# Patient Record
Sex: Male | Born: 1965 | Race: White | Hispanic: No | Marital: Single | Smoking: Current every day smoker
Health system: Southern US, Community
[De-identification: ages and names within clinical notes are randomized; demographics above are authoritative.]

## PROBLEM LIST (undated history)

## (undated) DIAGNOSIS — I10 Essential (primary) hypertension: Secondary | ICD-10-CM

## (undated) DIAGNOSIS — Z8711 Personal history of peptic ulcer disease: Secondary | ICD-10-CM

## (undated) DIAGNOSIS — Z8719 Personal history of other diseases of the digestive system: Secondary | ICD-10-CM

## (undated) HISTORY — PX: BACK SURGERY: SHX140

---

## 1999-01-23 ENCOUNTER — Emergency Department (HOSPITAL_COMMUNITY): Admission: EM | Admit: 1999-01-23 | Discharge: 1999-01-23 | Payer: Self-pay | Admitting: Emergency Medicine

## 1999-01-24 ENCOUNTER — Encounter: Payer: Self-pay | Admitting: Emergency Medicine

## 2002-03-30 ENCOUNTER — Emergency Department (HOSPITAL_COMMUNITY): Admission: EM | Admit: 2002-03-30 | Discharge: 2002-03-30 | Payer: Self-pay | Admitting: Emergency Medicine

## 2002-03-30 ENCOUNTER — Encounter: Payer: Self-pay | Admitting: Emergency Medicine

## 2015-05-06 ENCOUNTER — Emergency Department (HOSPITAL_COMMUNITY)
Admission: EM | Admit: 2015-05-06 | Discharge: 2015-05-06 | Disposition: A | Discharge status: Home / Self Care | Payer: Medicaid Other | Attending: Emergency Medicine | Admitting: Emergency Medicine

## 2015-05-06 ENCOUNTER — Emergency Department (HOSPITAL_COMMUNITY): Payer: Medicaid Other

## 2015-05-06 ENCOUNTER — Encounter (HOSPITAL_COMMUNITY): Payer: Self-pay | Admitting: Nurse Practitioner

## 2015-05-06 DIAGNOSIS — R55 Syncope and collapse: Secondary | ICD-10-CM | POA: Diagnosis not present

## 2015-05-06 DIAGNOSIS — M546 Pain in thoracic spine: Secondary | ICD-10-CM | POA: Insufficient documentation

## 2015-05-06 DIAGNOSIS — Z720 Tobacco use: Secondary | ICD-10-CM | POA: Insufficient documentation

## 2015-05-06 DIAGNOSIS — Z8719 Personal history of other diseases of the digestive system: Secondary | ICD-10-CM | POA: Diagnosis not present

## 2015-05-06 DIAGNOSIS — Z880 Allergy status to penicillin: Secondary | ICD-10-CM | POA: Insufficient documentation

## 2015-05-06 DIAGNOSIS — I10 Essential (primary) hypertension: Secondary | ICD-10-CM | POA: Insufficient documentation

## 2015-05-06 HISTORY — DX: Essential (primary) hypertension: I10

## 2015-05-06 HISTORY — DX: Personal history of other diseases of the digestive system: Z87.19

## 2015-05-06 HISTORY — DX: Personal history of peptic ulcer disease: Z87.11

## 2015-05-06 LAB — BASIC METABOLIC PANEL
Anion gap: 6 (ref 5–15)
BUN: 10 mg/dL (ref 6–20)
CO2: 25 mmol/L (ref 22–32)
Calcium: 9.1 mg/dL (ref 8.9–10.3)
Chloride: 109 mmol/L (ref 101–111)
Creatinine, Ser: 0.95 mg/dL (ref 0.61–1.24)
GFR calc Af Amer: >60 mL/min (ref >60)
GFR calc non Af Amer: >60 mL/min (ref >60)
Glucose, Bld: 105 mg/dL — ABNORMAL HIGH (ref 65–99)
Potassium: 3.8 mmol/L (ref 3.5–5.1)
Sodium: 140 mmol/L (ref 135–145)

## 2015-05-06 LAB — CBC
HCT: 38.6 % — ABNORMAL LOW (ref 39.0–52.0)
Hemoglobin: 13.1 g/dL (ref 13.0–17.0)
MCH: 32.8 pg (ref 26.0–34.0)
MCHC: 33.9 g/dL (ref 30.0–36.0)
MCV: 96.7 fL (ref 78.0–100.0)
Platelets: 190 10*3/uL (ref 150–400)
RBC: 3.99 MIL/uL — ABNORMAL LOW (ref 4.22–5.81)
RDW: 13.4 % (ref 11.5–15.5)
WBC: 11 10*3/uL — ABNORMAL HIGH (ref 4.0–10.5)

## 2015-05-06 LAB — I-STAT CG4 LACTIC ACID, ED: Lactic Acid, Venous: 1.33 mmol/L (ref 0.5–2.0)

## 2015-05-06 MED ORDER — SODIUM CHLORIDE 0.9 % IV BOLUS (SEPSIS)
1000.0000 mL | Freq: Once | INTRAVENOUS | Status: AC
  Administered 2015-05-06: 1000 mL via INTRAVENOUS

## 2015-05-06 MED ORDER — OXYCODONE HCL 5 MG PO TABS
5.0000 mg | ORAL_TABLET | Freq: Once | ORAL | Status: AC
  Administered 2015-05-06: 5 mg via ORAL
  Filled 2015-05-06: qty 1

## 2015-05-06 NOTE — Discharge Instructions (Signed)
Near-Syncope °Near-syncope (commonly known as near fainting) is sudden weakness, dizziness, or feeling like you might pass out. During an episode of near-syncope, you may also develop pale skin, have tunnel vision, or feel sick to your stomach (nauseous). Near-syncope may occur when getting up after sitting or while standing for a long time. It is caused by a sudden decrease in blood flow to the brain. This decrease can result from various causes or triggers, most of which are not serious. However, because near-syncope can sometimes be a sign of something serious, a medical evaluation is required. The specific cause is often not determined. °HOME CARE INSTRUCTIONS  °Monitor your condition for any changes. The following actions may help to alleviate any discomfort you are experiencing: °· Have someone stay with you until you feel stable. °· Lie down right away and prop your feet up if you start feeling like you might faint. Breathe deeply and steadily. Wait until all the symptoms have passed. Most of these episodes last only a few minutes. You may feel tired for several hours.   °· Drink enough fluids to keep your urine clear or pale yellow.   °· If you are taking blood pressure or heart medicine, get up slowly when seated or lying down. Take several minutes to sit and then stand. This can reduce dizziness. °· Follow up with your health care provider as directed.  °SEEK IMMEDIATE MEDICAL CARE IF:  °· You have a severe headache.   °· You have unusual pain in the chest, abdomen, or back.   °· You are bleeding from the mouth or rectum, or you have black or tarry stool.   °· You have an irregular or very fast heartbeat.   °· You have repeated fainting or have seizure-like jerking during an episode.   °· You faint when sitting or lying down.   °· You have confusion.   °· You have difficulty walking.   °· You have severe weakness.   °· You have vision problems.   °MAKE SURE YOU:  °· Understand these instructions. °· Will  watch your condition. °· Will get help right away if you are not doing well or get worse. °Document Released: 07/21/2005 Document Revised: 07/26/2013 Document Reviewed: 12/24/2012 °ExitCare® Patient Information ©2015 [employer]. This information is not intended to replace advice given to you by your health care provider. Make sure you discuss any questions you have with your health care provider. ° ° °Emergency Department Resource Guide °1) Find a Doctor and Pay Out of Pocket °Although you won't have to find out who is covered by your insurance plan, it is a good idea to ask around and get recommendations. You will then need to call the office and see if the doctor you have chosen will accept you as a new patient and what types of options they offer for patients who are self-pay. Some doctors offer discounts or will set up payment plans for their patients who do not have insurance, but you will need to ask so you aren't surprised when you get to your appointment. ° °2) Contact Your Local Health Department °Not all health departments have doctors that can see patients for sick visits, but many do, so it is worth a call to see if yours does. If you don't know where your local health department is, you can check in your phone book. The CDC also has a tool to help you locate your state's health department, and many state websites also have listings of all of their local health departments. ° °3) Find a Walk-in Clinic °  If your illness is not likely to be very severe or complicated, you may want to try a walk in clinic. These are popping up all over the country in pharmacies, drugstores, and shopping centers. They're usually staffed by nurse practitioners or physician assistants that have been trained to treat common illnesses and complaints. They're usually fairly quick and inexpensive. However, if you have serious medical issues or chronic medical problems, these are probably not your best option. ° °No Primary Care  Doctor: °- Call [facility] at  832-8000 - they can help you locate a primary care doctor that  accepts your insurance, provides certain services, etc. °- Physician Referral Service- 1-800-533-3463 ° °Chronic Pain Problems: °Organization         Address  Phone   Notes  °[facility]  (336) 297-2271 Patients need to be referred by their primary care doctor.  ° °Medication Assistance: °Organization         Address  Phone   Notes  °[place] [facility] [address] °[place], [place] [address] (336) 641-8030 --Must be a resident of [place] °-- Must have NO insurance coverage whatsoever (no Medicaid/ Medicare, etc.) °-- The pt. MUST have a primary care doctor that directs their care regularly and follows them in the community °  °[facility]  (866) 331-1348   °[employer]  (888) 892-1162   ° °Agencies that provide inexpensive medical care: °Organization         Address  Phone   Notes  °[facility]  (336) 832-8035   °[facility]    (336) 832-7272   °[facility] [address] °[place], [place] [address] (336) 832-4777   °[facility] [address], °[place] (336) 271-4999   °[facility]    (336) 373-0678   °[facility]    (336) 272-1050   °[facility] ° [address], [place] Phone:  (336) 832-4444, Fax:  (336) 832-4440 Hours of Operation:  9 am - 6 pm, M-F.  Also accepts Medicaid/Medicare and self-pay.  °[facility] ° [address], [place] Phone: (336) 832-3150, Fax: (336) 832-3151. Hours of Operation:  8:30 am - 5:30 pm, M-F.  Also accepts Medicaid and self-pay.  °[facility] [place] [address], [place] Phone: (336) 878-6027   °[facility] [address], [place], [place] (336)723-1848, Ext. 123 Mondays & Thursdays: 7-9 AM.  First 15 patients are seen on a first  come, first serve basis. °  ° °Medicaid-accepting [place] Providers: ° °Organization         Address  Phone   Notes  °[facility] [address], [place] (336) 641-2100 Also accepts self-pay patients.  °[facility] [address], [place] ° (336) 856-9996   °[facility] [address], [place] (336) 288-8857   °[facility] [address] [place] [address], [place] (336) 299-7000   °Veita Bland [address], [place]  ° (336) 373-1557 Only accepts [place] Access Medicaid patients after they have their name applied to their card.  ° °Self-Pay (no insurance) in [place]: ° °Organization         Address  Phone   Notes  °Sickle Cell Patients, [facility] [address], [place] (336)   832-1970   °[facility] [address], [place] (336) 832-4400   °[facility] ° [address], [place] (336) 992-4800   °[facility]/Dr. Osei-Bonsu ° [address] [place] [address], [place] or [address], [place] (336) [phone] Phone number for both [place] and [place] locations is the same.  °[facility] [address], [place] (336) 299-0000   °[facility] [address] [place] [address], [place] or [address] (336) 852-7530 °(336) 878-2260   °[facility] [address], [place] (336) 350-1642, phone; (336) 294-5005, fax Sees patients 1st and 3rd Saturday of every month.  Must not qualify for public or private insurance (i.e. Medicaid, Medicare, [place] Health Choice, Veterans' Benefits) • Household income should be no more than 200% of the poverty level •The clinic cannot treat you if you are pregnant or think you are pregnant • Sexually transmitted diseases are not treated at the clinic.  ° ° °Dental Care: °Organization          Address  Phone  Notes  °[facility] [address], [place] (336) 641-6152 Accepts children up to age 21 who are enrolled in Medicaid or [place] Health Choice; pregnant women with a Medicaid card; and children who have applied for Medicaid or [place] Health Choice, but were declined, whose parents can pay a reduced fee at time of service.  °[facility] [place]  [address], [place] (336) 641-7733 Accepts children up to age 21 who are enrolled in Medicaid or [place] Health Choice; pregnant women with a Medicaid card; and children who have applied for Medicaid or [place] Health Choice, but were declined, whose parents can pay a reduced fee at time of service.  °[facility] ° [address], [place] (336) 641-4533 Patients are seen by appointment only. Walk-ins are not accepted. [facility] will see patients 18 years of age and older. °Monday - Tuesday (8am-5pm) °Most Wednesdays (8:30-5pm) °$30 per visit, cash only  °[facility] ° [address], [place] (336) 641-4533 Patients are seen by appointment only. Walk-ins are not accepted. [facility] will see patients 18 years of age and older. °One Wednesday Evening (Monthly: Volunteer Based).  $30 per visit, cash only  °[facility]  (919) 537-3737 for adults; Children under age 4, call Graduate Pediatric Dentistry at ([phone]) [phone]. Children aged 4-14, please call (919) [phone] to request a pediatric application. ° Dental services are provided in all areas of dental care including fillings, crowns and bridges, complete and partial dentures, implants, gum treatment, root canals, and extractions. Preventive care is also provided. Treatment is provided to both adults and children. °Patients are selected via a lottery and there is often a waiting list. °  °[facility] [address], ° ° (336) 763-8833 [email or web address] °  °[facility] [address], [place], [place] (336)723-1848, Ext. 123 Second and Fourth Thursday of each month, opens at 6:30 AM; Clinic ends at 9 AM.  Patients are seen on a first-come first-served basis, and a limited number are seen during each clinic.  ° °[facility] ° [address], [place], [place] (336) 723-7904   Eligibility Requirements °You must have lived in [place], [place], or [place] counties for   at least the last three months. °  You cannot be eligible for state or federal sponsored healthcare insurance, including Veterans Administration, Medicaid, or Medicare. °  You generally cannot be eligible for healthcare insurance through your employer.  °  How to apply: °Eligibility screenings are held every Tuesday and Wednesday afternoon from 1:00 pm until 4:00 pm. You do not need an appointment for the interview!  °[facility] [address], [place], [place] 336-631-2330   °[facility]  336-342-8273   °[facility]  336-703-3100   °[facility]  336-570-6415   ° °Behavioral Health Resources in the Community: °Intensive Outpatient Programs °Organization         Address  Phone  Notes  °[facility] [address], [place], [place] 336-878-6098   °[facility] [address], [place], [place] 336-832-9800   °[facility] [address], [place], [place] ° 336-882-2125   °[facility] [address],  °[place], [place] 1-800-853-5163 or 336-641-4981   °Substance Abuse Resources °Organization         Address  Phone  Notes  °[facility]  336-882-2125   °[facility]  336-784-9470   °[facility]  336-285-9073   °[facility]  336-845-3988   °Residential & Outpatient Substance Abuse Program  1-800-659-3381   °Psychological  Services °Organization         Address  Phone  Notes  °[facility]  336- 832-9600   °[facility]  336- 378-7881   °[facility] [address], [place] 1-800-853-5163 or 336-641-4981   ° °Mobile Crisis Teams °Organization         Address  Phone  Notes  °[facility], Mobile Crisis Care Unit  1-877-626-1772   °Assertive °[facility] ° [address] [place], [place] 336-834-9664   °Sharon DeEsch [address] °[place] 336-554-5454   ° °Self-Help/Support Groups °Organization         Address  Phone             Notes  °[facility] - variety of support groups  336- 373-1402 Call for more information  °Narcotics Anonymous (NA), [facility] [address], °[place]  2 meetings at this location  ° °Residential Treatment Programs °Organization         Address  Phone  Notes  °[facility] [address],    °[place]  1-866-801-8205   °[facility] ° [address], [place], [place] 704-293-8524   °[facility] [address], [place] 336-845-3988 Admissions: 8am-3pm M-F  °[facility] [address]    °[place], [place] 336-841-1104   °[facility] [address], [place], [place] 336-379-7146   °[facility] [address]  °[place], [place] 336-285-9073   °[facility] - Intensive Outpatient [address], [place], [place] 336-852-3033   °[facility] [address]  °[place], [place] 1-877-615-2722 or 336-784-9470   °[facility] [address] [place], [place] 336-227-7417 Accepts Medicaid  °[facility] [address]  °[place] 1-800-659-3381 Substance Abuse/Addiction Treatment  ° °[place] Behavioral Health Resources °Organization         Address  Phone  Notes  °[facility]  (888)    581-9988   °Julie Brannon, PhD [address] [place], [place]   (336) 349-5553 or (336) 951-0000   °[facility]   [address] °[place], [place] (336) 349-4454   °[facility] [address], [place], [place] (336) 342-8316 Insurance/Medicaid/sponsorship through [facility]  °[facility] [address]                                    [place], [place] (336) 342-8316 Therapy/tele-psych/case  °[facility] [address]  ° [place], [place] (336) 349-2233    °Dr. Arfeen  (336) 349-4544   °[facility] [place]  [employer] [place] [facility] 1) [address], [place] °2) [address], [place] °3)  [address], [place] (336) 349-3220 °(336) 342-7768 ° °(336) 342-8140   °[place] [facility] (336) 342-1394 or (336) 342-3537 (After Hours)    ° ° ° °
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
## 2015-05-06 NOTE — ED Provider Notes (Signed)
CSN: [id number]     Arrival date & time 05/06/15  1815 History   First MD Initiated Contact with Patient 05/06/15 1827     Chief Complaint  Patient presents with  . Near Syncope     (Consider location/radiation/quality/duration/timing/severity/associated sxs/prior Treatment) Patient is a 49 y.o. male presenting with near-syncope. The history is provided by the patient.  Near Syncope This is a new problem. The current episode started less than 1 hour ago. The problem occurs constantly. The problem has been resolved. Pertinent negatives include no chest pain, no abdominal pain and no shortness of breath. Nothing aggravates the symptoms. Nothing relieves the symptoms.    Past Medical History  Diagnosis Date  . Hypertension   . History of stomach ulcers    Past Surgical History  Procedure Laterality Date  . Back surgery     No family history on file. Social History  Substance Use Topics  . Smoking status: Current Every Day Smoker -- 1.00 packs/day    Types: Cigarettes  . Smokeless tobacco: Never Used  . Alcohol Use: Yes     Comment: Occasional    Review of Systems  Constitutional: Negative for fever.  Respiratory: Negative for cough and shortness of breath.   Cardiovascular: Positive for near-syncope. Negative for chest pain and leg swelling.  Gastrointestinal: Negative for vomiting and abdominal pain.  All other systems reviewed and are negative.     Allergies  Penicillins and Ibuprofen  Home Medications   Prior to Admission medications   Not on File   BP 104/61 mmHg  Pulse 78  Temp(Src) 98.4 F (36.9 C) (Oral)  Resp 16  SpO2 97% Physical Exam  Constitutional: He is oriented to person, place, and time. He appears well-developed and well-nourished. No distress.  HENT:  Head: Normocephalic and atraumatic.  Mouth/Throat: Oropharynx is clear and moist. No oropharyngeal exudate.  Eyes: EOM are normal. Pupils are equal, round, and reactive to light.  Neck:  Normal range of motion. Neck supple.  Cardiovascular: Normal rate and regular rhythm.  Exam reveals no friction rub.   No murmur heard. Pulmonary/Chest: Effort normal and breath sounds normal. No respiratory distress. He has no wheezes. He has no rales.  Abdominal: Soft. He exhibits no distension. There is no tenderness. There is no rebound.  Musculoskeletal: Normal range of motion. He exhibits no edema.       Thoracic back: He exhibits bony tenderness (lower thoracic spine).  Neurological: He is alert and oriented to person, place, and time.  Skin: No rash noted. He is not diaphoretic.  Nursing note and vitals reviewed.   ED Course  Procedures (including critical care time) Labs Review Labs Reviewed  BASIC METABOLIC PANEL - Abnormal; Notable for the following:    Glucose, Bld 105 (*)    All other components within normal limits  CBC - Abnormal; Notable for the following:    WBC 11.0 (*)    RBC 3.99 (*)    HCT 38.6 (*)    All other components within normal limits  I-STAT CG4 LACTIC ACID, ED    Imaging Review Dg Chest 2 View  05/06/2015   CLINICAL DATA:  Fall.  Near syncope.  Thoracic back pain.  EXAM: CHEST  2 VIEW  COMPARISON:  None.  FINDINGS: Normal heart size. Normal mediastinal contour. No pneumothorax. No pleural effusion. Clear lungs, with no focal lung consolidation and no pulmonary edema. No displaced fracture in the visualized chest.  IMPRESSION: No active disease in the chest.  Electronically Signed   By: Delbert Phenix M.D.   On: 05/06/2015 21:13   Dg Thoracic Spine 2 View  05/06/2015   CLINICAL DATA:  Acute onset of upper back pain after syncope. Initial encounter.  EXAM: THORACIC SPINE 2 VIEWS  COMPARISON:  None.  FINDINGS: There is no evidence of fracture or subluxation. Vertebral bodies demonstrate normal height and alignment. Intervertebral disc spaces are preserved.  The visualized portions of both lungs are clear. The mediastinum is unremarkable in appearance. Clips  are noted within the right upper quadrant, reflecting prior cholecystectomy.  IMPRESSION: No evidence of fracture or subluxation along the thoracic spine.   Electronically Signed   By: Roanna Raider M.D.   On: 05/06/2015 21:13   I have personally reviewed and evaluated these images and lab results as part of my medical decision-making.   EKG Interpretation None      MDM   Final diagnoses:  Near syncope    44M here after a near syncopal event. He was standing up watching the ballgame and felt dizzy and then fell backwards. He did not his head. He did not lose conscious. He feels better now. He is mildly hypotensive on prior arrival in the 90s, but is improving. He could not stand her orthostatics because he was too dizzy. Denies any chest pain, vomiting, shortness of breath. He does have some lower thoracic back pain. Will check labs and x-ray. Labs okay. X-rays are okay. Blood pressures improved with fluids. He's doing well and relaxing comfortably. Stable for discharge.  Elwin Mocha, MD 05/07/15 [phone]

## 2015-05-06 NOTE — ED Notes (Signed)
Patient transported to X-ray 

## 2015-05-06 NOTE — ED Notes (Signed)
Bed: WU98 Expected date:  Expected time:  Means of arrival:  Comments: EMS- 49 yo syncope

## 2017-04-04 IMAGING — CR DG THORACIC SPINE 2V
4 series · 4 of 4 positions shown · non-contrast
Comparison: None.

CLINICAL DATA: Acute onset of upper back pain after syncope.
Initial encounter.

EXAM:
THORACIC SPINE 2 VIEWS

[w thoracic spine ap]
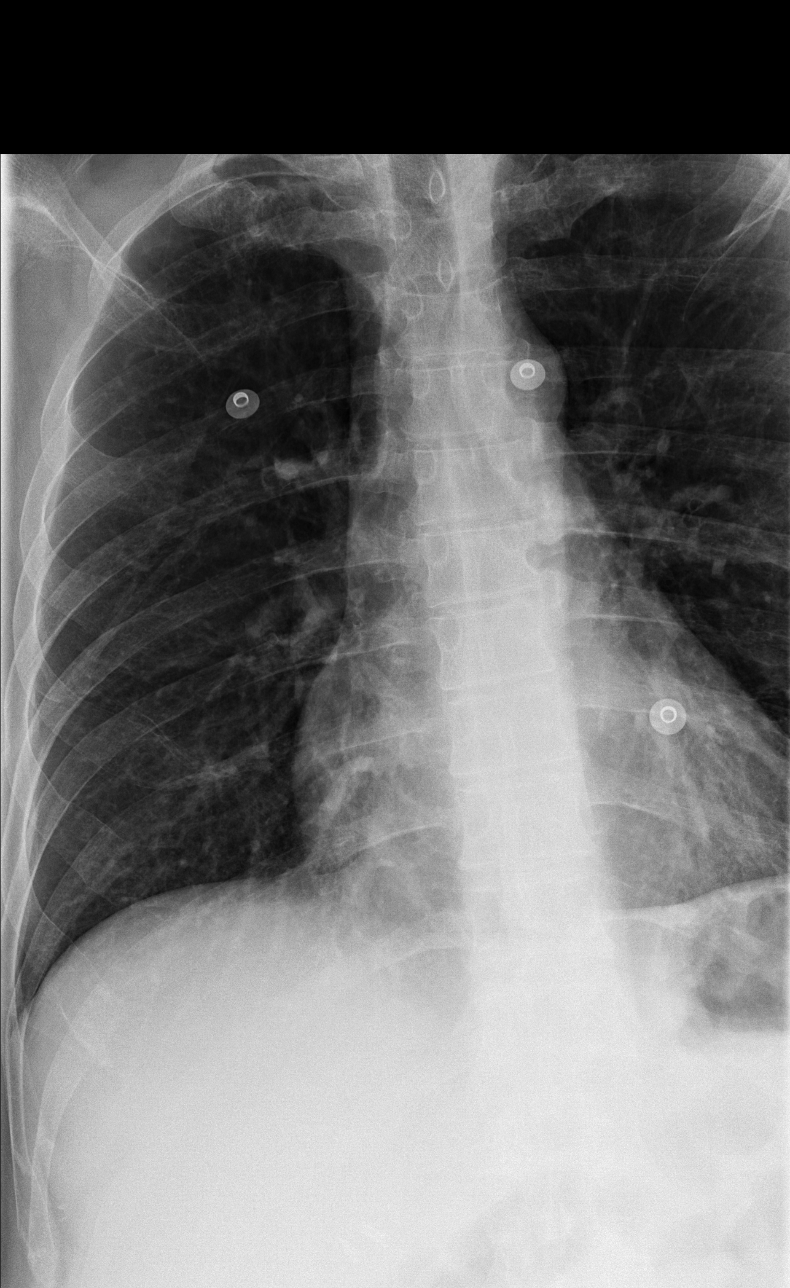

[t thoracic spine ap]
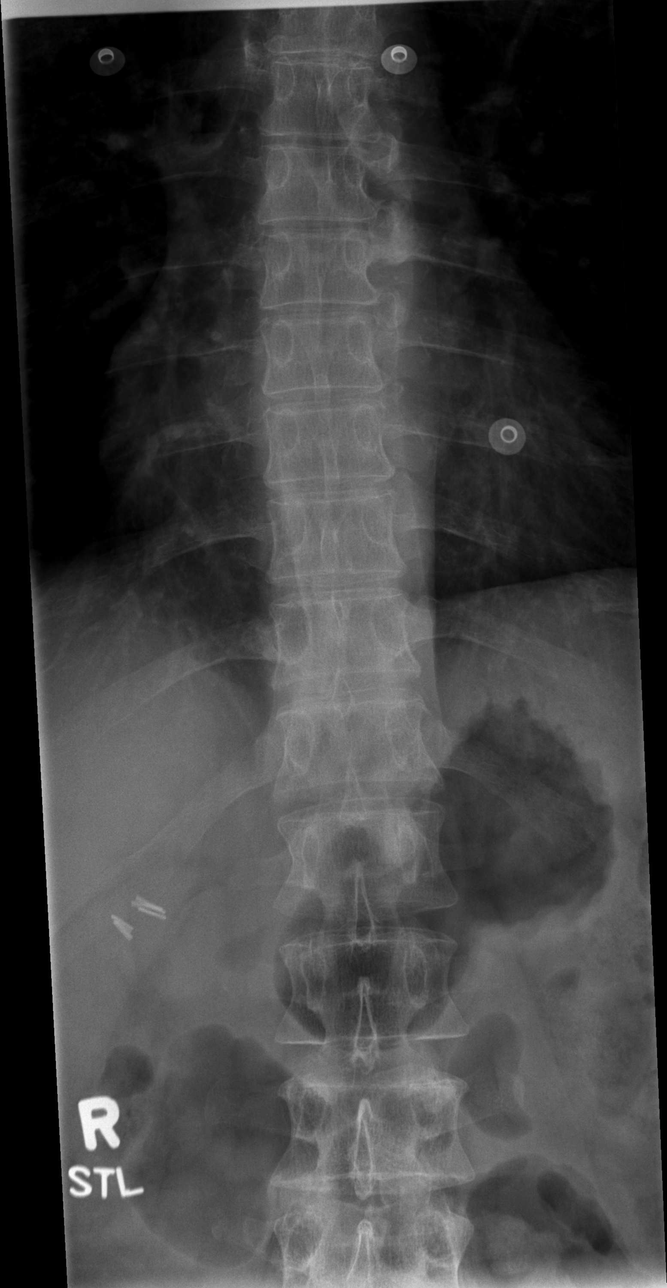

[t thoracic spine lat]
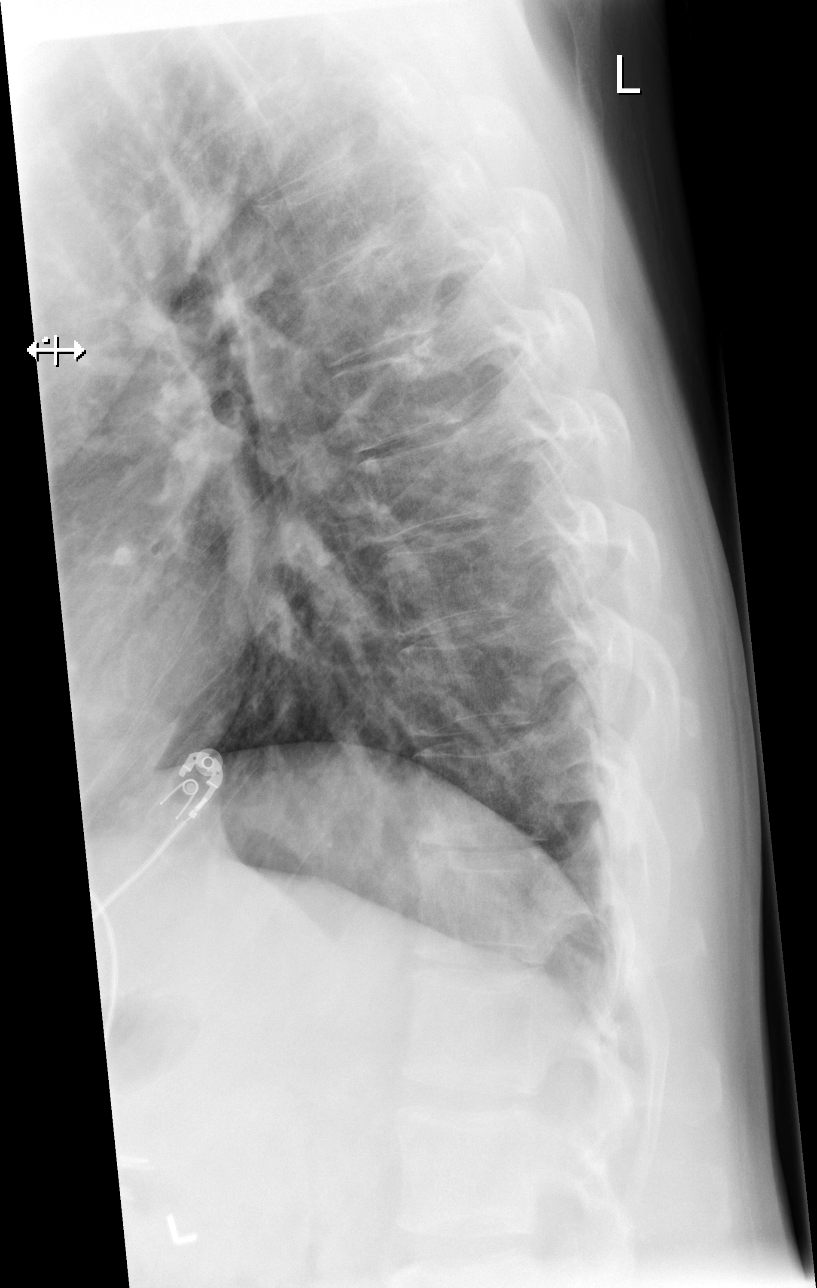

[t thoracic swimmers]
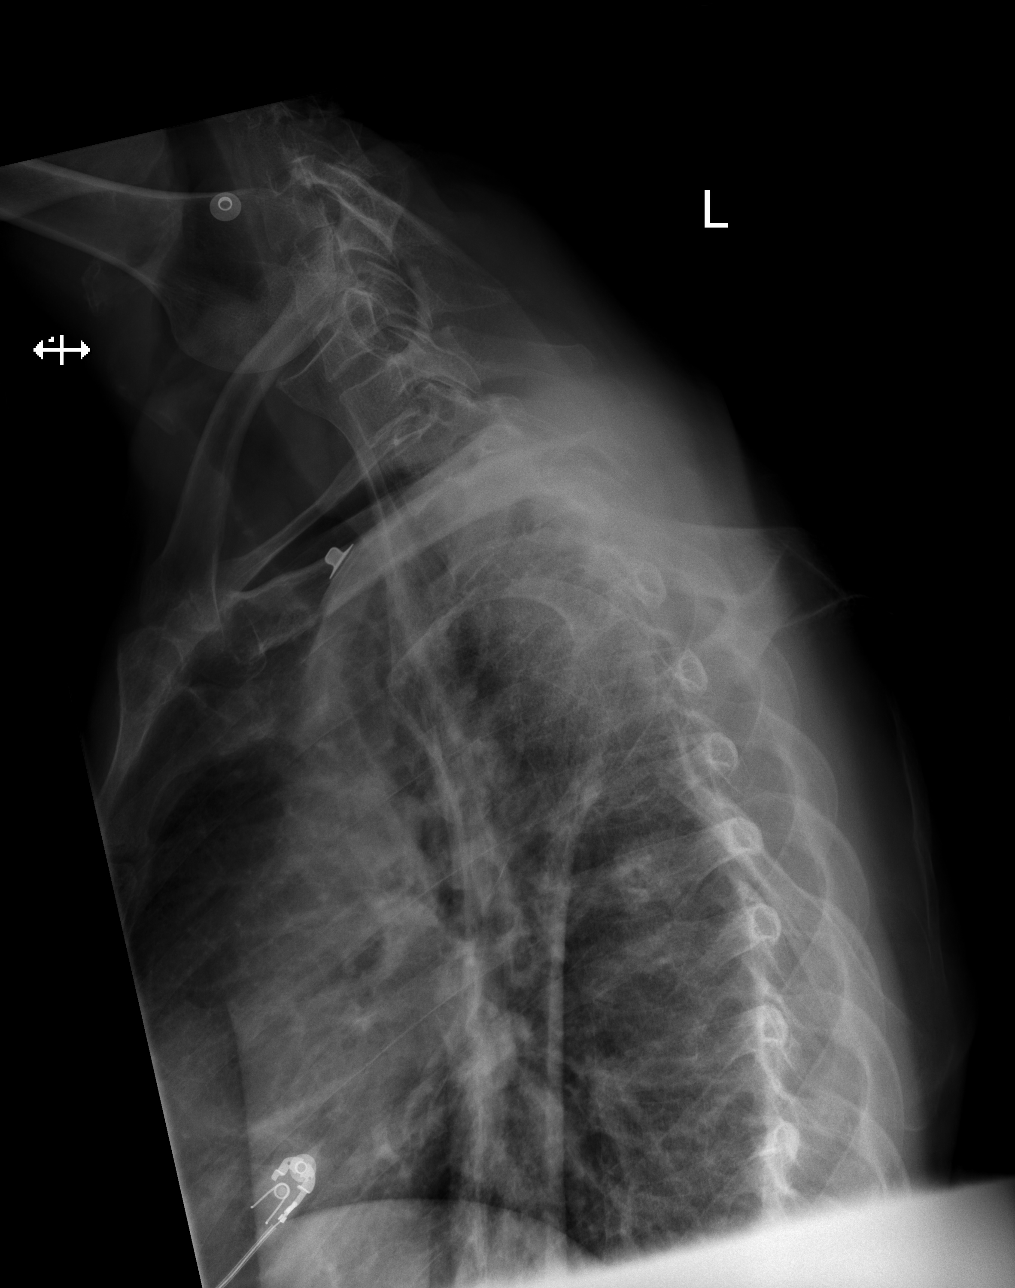

[4 of 4 positions shown; findings below may reference images not displayed]

FINDINGS: There is no evidence of fracture or subluxation. Vertebral bodies
demonstrate normal height and alignment. Intervertebral disc spaces
are preserved.

The visualized portions of both lungs are clear. The mediastinum is
unremarkable in appearance. Clips are noted within the right upper
quadrant, reflecting prior cholecystectomy.
IMPRESSION: No evidence of fracture or subluxation along the thoracic spine.

## 2021-12-02 DEATH — deceased
